# Patient Record
Sex: Female | Born: 1975 | Race: White | Hispanic: No | Smoking: Former smoker
Health system: Southern US, Community
[De-identification: ages and names within clinical notes are randomized; demographics above are authoritative.]

## PROBLEM LIST (undated history)

## (undated) DIAGNOSIS — E119 Type 2 diabetes mellitus without complications: Secondary | ICD-10-CM

## (undated) DIAGNOSIS — N200 Calculus of kidney: Secondary | ICD-10-CM

## (undated) DIAGNOSIS — F419 Anxiety disorder, unspecified: Secondary | ICD-10-CM

## (undated) DIAGNOSIS — D649 Anemia, unspecified: Secondary | ICD-10-CM

## (undated) HISTORY — DX: Calculus of kidney: N20.0

## (undated) HISTORY — DX: Type 2 diabetes mellitus without complications: E11.9

## (undated) HISTORY — DX: Anxiety disorder, unspecified: F41.9

## (undated) HISTORY — DX: Anemia, unspecified: D64.9

---

## 2007-06-19 ENCOUNTER — Emergency Department (HOSPITAL_COMMUNITY): Admission: EM | Admit: 2007-06-19 | Discharge: 2007-06-20 | Payer: Self-pay | Admitting: Emergency Medicine

## 2009-01-09 ENCOUNTER — Emergency Department (HOSPITAL_COMMUNITY): Admission: EM | Admit: 2009-01-09 | Discharge: 2009-01-09 | Payer: Self-pay | Admitting: Emergency Medicine

## 2009-01-24 ENCOUNTER — Other Ambulatory Visit: Admission: RE | Admit: 2009-01-24 | Discharge: 2009-01-24 | Payer: Self-pay | Admitting: Obstetrics and Gynecology

## 2009-07-25 ENCOUNTER — Inpatient Hospital Stay (HOSPITAL_COMMUNITY): Admission: AD | Admit: 2009-07-25 | Discharge: 2009-07-25 | Payer: Self-pay | Admitting: Obstetrics and Gynecology

## 2009-08-01 ENCOUNTER — Ambulatory Visit (HOSPITAL_COMMUNITY): Admission: RE | Admit: 2009-08-01 | Discharge: 2009-08-01 | Payer: Self-pay | Admitting: Obstetrics and Gynecology

## 2009-08-04 ENCOUNTER — Inpatient Hospital Stay (HOSPITAL_COMMUNITY): Admission: AD | Admit: 2009-08-04 | Discharge: 2009-08-06 | Payer: Self-pay | Admitting: Obstetrics and Gynecology

## 2010-04-20 ENCOUNTER — Encounter: Payer: Self-pay | Admitting: Obstetrics and Gynecology

## 2010-06-17 LAB — CBC
HCT: 32.1 % — ABNORMAL LOW (ref 36.0–46.0)
HCT: 35.7 % — ABNORMAL LOW (ref 36.0–46.0)
Hemoglobin: 10.8 g/dL — ABNORMAL LOW (ref 12.0–15.0)
MCHC: 33.7 g/dL (ref 30.0–36.0)
MCV: 89.4 fL (ref 78.0–100.0)
MCV: 89.7 fL (ref 78.0–100.0)
Platelets: 207 10*3/uL (ref 150–400)
RBC: 3.59 MIL/uL — ABNORMAL LOW (ref 3.87–5.11)
RBC: 3.98 MIL/uL (ref 3.87–5.11)
WBC: 17.1 10*3/uL — ABNORMAL HIGH (ref 4.0–10.5)

## 2010-06-17 LAB — URINE MICROSCOPIC-ADD ON

## 2010-06-17 LAB — URINALYSIS, ROUTINE W REFLEX MICROSCOPIC
Bilirubin Urine: NEGATIVE
Glucose, UA: NEGATIVE mg/dL
Specific Gravity, Urine: 1.02 (ref 1.005–1.030)
pH: 6.5 (ref 5.0–8.0)

## 2010-07-03 LAB — URINE MICROSCOPIC-ADD ON

## 2010-07-03 LAB — URINALYSIS, ROUTINE W REFLEX MICROSCOPIC
Leukocytes, UA: NEGATIVE
Nitrite: NEGATIVE
Specific Gravity, Urine: 1.009 (ref 1.005–1.030)
Urobilinogen, UA: 0.2 mg/dL (ref 0.0–1.0)
pH: 7.5 (ref 5.0–8.0)

## 2010-07-03 LAB — DIFFERENTIAL
Basophils Absolute: 0.1 10*3/uL (ref 0.0–0.1)
Basophils Relative: 1 % (ref 0–1)
Eosinophils Absolute: 0.1 10*3/uL (ref 0.0–0.7)
Monocytes Absolute: 0.8 10*3/uL (ref 0.1–1.0)
Monocytes Relative: 6 % (ref 3–12)
Neutro Abs: 9.6 10*3/uL — ABNORMAL HIGH (ref 1.7–7.7)

## 2010-07-03 LAB — WET PREP, GENITAL
WBC, Wet Prep HPF POC: NONE SEEN
Yeast Wet Prep HPF POC: NONE SEEN

## 2010-07-03 LAB — GC/CHLAMYDIA PROBE AMP, GENITAL
Chlamydia, DNA Probe: NEGATIVE
GC Probe Amp, Genital: NEGATIVE

## 2010-07-03 LAB — RHOGAM INJECTION

## 2010-07-03 LAB — CBC
Hemoglobin: 12.4 g/dL (ref 12.0–15.0)
MCHC: 34.4 g/dL (ref 30.0–36.0)
MCV: 95.7 fL (ref 78.0–100.0)
RDW: 12.8 % (ref 11.5–15.5)

## 2010-07-03 LAB — BASIC METABOLIC PANEL
CO2: 24 mEq/L (ref 19–32)
Calcium: 9.2 mg/dL (ref 8.4–10.5)
Chloride: 104 mEq/L (ref 96–112)
Creatinine, Ser: 0.52 mg/dL (ref 0.4–1.2)
Glucose, Bld: 89 mg/dL (ref 70–99)
Sodium: 137 mEq/L (ref 135–145)

## 2010-12-22 LAB — URINALYSIS, ROUTINE W REFLEX MICROSCOPIC
Glucose, UA: NEGATIVE
Ketones, ur: 15 — AB
Leukocytes, UA: NEGATIVE
Protein, ur: 30 — AB
pH: 6

## 2010-12-22 LAB — URINE MICROSCOPIC-ADD ON

## 2010-12-22 LAB — URINE CULTURE

## 2010-12-22 LAB — POCT PREGNANCY, URINE: Operator id: 277751

## 2017-03-30 HISTORY — PX: CRYOABLATION: SHX1415

## 2019-06-07 ENCOUNTER — Other Ambulatory Visit: Payer: Self-pay | Admitting: Obstetrics & Gynecology

## 2019-06-07 ENCOUNTER — Other Ambulatory Visit: Payer: Self-pay | Admitting: *Deleted

## 2019-06-07 DIAGNOSIS — R928 Other abnormal and inconclusive findings on diagnostic imaging of breast: Secondary | ICD-10-CM

## 2019-06-13 ENCOUNTER — Ambulatory Visit
Admission: RE | Admit: 2019-06-13 | Discharge: 2019-06-13 | Disposition: A | Discharge status: Home / Self Care | Payer: Self-pay | Source: Ambulatory Visit | Attending: Obstetrics & Gynecology | Admitting: Obstetrics & Gynecology

## 2019-06-13 ENCOUNTER — Other Ambulatory Visit: Payer: Self-pay

## 2019-06-13 DIAGNOSIS — R928 Other abnormal and inconclusive findings on diagnostic imaging of breast: Secondary | ICD-10-CM

## 2020-08-15 ENCOUNTER — Encounter: Payer: Self-pay | Admitting: Nurse Practitioner

## 2020-08-15 ENCOUNTER — Ambulatory Visit (INDEPENDENT_AMBULATORY_CARE_PROVIDER_SITE_OTHER): Payer: Self-pay | Admitting: Nurse Practitioner

## 2020-08-15 VITALS — BP 120/76 | HR 76 | Temp 97.8°F | Ht 61.0 in | Wt 120.0 lb

## 2020-08-15 DIAGNOSIS — Z6822 Body mass index (BMI) 22.0-22.9, adult: Secondary | ICD-10-CM

## 2020-08-15 DIAGNOSIS — N920 Excessive and frequent menstruation with regular cycle: Secondary | ICD-10-CM

## 2020-08-15 DIAGNOSIS — Z7689 Persons encountering health services in other specified circumstances: Secondary | ICD-10-CM

## 2020-08-15 DIAGNOSIS — N200 Calculus of kidney: Secondary | ICD-10-CM

## 2020-08-15 DIAGNOSIS — L52 Erythema nodosum: Secondary | ICD-10-CM

## 2020-08-15 DIAGNOSIS — D649 Anemia, unspecified: Secondary | ICD-10-CM

## 2020-08-15 DIAGNOSIS — F411 Generalized anxiety disorder: Secondary | ICD-10-CM

## 2020-08-15 DIAGNOSIS — Z87891 Personal history of nicotine dependence: Secondary | ICD-10-CM

## 2020-08-15 NOTE — Progress Notes (Signed)
New Patient Office Visit  Subjective:  Patient ID: Xela Oregel, female    DOB: 1975-08-20  Age: 45 y.o. MRN: [id number]  CC: Anemia   HPI Azhar Yogi is a 45 year old Caucasian female that presents to establish care with a primary care provider. This is her initial visit to the office.She was seen at [facility] on 07/27/20 for left flank pain and skin lesions on bilateral legs/face. She tells me she has chronic kidney stones. CBC drawn during that visit revealed slight anemia with Hgb 11.2 and 33.6. Shambria tells me that she has chronic anemia for several years. States menstrual cycles menstrual cycles are regular with menorrhagia. She tells me she bruises easily, has fatigue, and becomes mildly short of breath with activity. She is a former cigarette smoker; quit 4 years ago. Denies previous history of COPD or chronic cough.   Kyarra states she has not had a routine eye or dental exam in the past three years. She had an abnormal mammogram 2021 that required diagnostic ultrasound, which was negative. She prefers to follow-up with physician she saw last year for next mammogram screening. Kajah tells me she had an abnormal pap smear in 2019 and had cryotherapy surgery.   Paidyn states she has a history of anxiety.She is not currently taking any medication to alleviate symptoms or in counseling. GAD-7 score 6 and PHQ-9 score 2 today in office. Keyari states she has experienced increased stress due to assisting aging parents with failing health and home schooling /raising 46 year-old daughter. She declined need for pharmacotherapy management or counseling therapy at this time.   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 She denies recent strep pharyngitis or illness. States lesions have subsided on bilateral legs and face.She tells me that resemble bruised like areas on skin. States they are not bothersome.  Social History   Socioeconomic History  .  Marital status: Single    Spouse name: Not on file  . Number of children: Not on file  . Years of education: Not on file  . Highest education level: Not on file  Occupational History  . Not on file  Tobacco Use  . Smoking status: Not on file  . Smokeless tobacco: Not on file  Substance and Sexual Activity  . Alcohol use: Not on file  . Drug use: Not on file  . Sexual activity: Not on file  Other Topics Concern  . Not on file  Social History Narrative  . Not on file    ROS Review of Systems  Constitutional: Positive for fatigue. Negative for appetite change and unexpected weight change.  HENT: Positive for congestion. Negative for ear pain, rhinorrhea, sinus pressure, sinus pain and tinnitus.   Eyes: Negative for pain.  Respiratory: Positive for shortness of breath (mild with activity). Negative for cough.   Cardiovascular: Negative for chest pain, palpitations and leg swelling.  Gastrointestinal: Negative for abdominal pain, constipation, diarrhea, nausea and vomiting.  Endocrine: Negative for cold intolerance, heat intolerance, polydipsia, polyphagia and polyuria.  Genitourinary: Positive for frequency and menstrual problem (heavy menstrual ). Negative for dysuria and hematuria.       Chronic kidney stones  Musculoskeletal: Negative for arthralgias, back pain, joint swelling and myalgias.  Skin: Negative for rash.       Erythema nordosum  Allergic/Immunologic: Negative for environmental allergies.  Neurological: Negative for dizziness, seizures, syncope and headaches.  Hematological: Negative for adenopathy. Bruises/bleeds easily (bruises easily).  Psychiatric/Behavioral: Positive for sleep  disturbance. Negative for decreased concentration. The patient is not nervous/anxious.     Objective:   Today's Vitals: BP 120/76 (BP Location: Left Arm, Patient Position: Sitting)   Pulse 76   Temp 97.8 F (36.6 C) (Temporal)   Ht 5\' 1"  (1.549 m)   Wt 120 lb (54.4 kg)   SpO2 99%    BMI 22.67 kg/m   Physical Exam Vitals reviewed.  Constitutional:      Appearance: Normal appearance.  HENT:     Head: Normocephalic.     Right Ear: Tympanic membrane normal.     Left Ear: Tympanic membrane normal.     Nose: Nose normal.     Mouth/Throat:     Mouth: Mucous membranes are moist.  Eyes:     Pupils: Pupils are equal, round, and reactive to light.  Cardiovascular:     Rate and Rhythm: Normal rate and regular rhythm.     Pulses: Normal pulses.     Heart sounds: Normal heart sounds.  Pulmonary:     Effort: Pulmonary effort is normal.     Breath sounds: Normal breath sounds.  Abdominal:     General: Bowel sounds are normal.     Palpations: Abdomen is soft.  Musculoskeletal:        General: Normal range of motion.     Cervical back: Neck supple.  Skin:    General: Skin is warm and dry.     Capillary Refill: Capillary refill takes less than 2 seconds.  Neurological:     General: No focal deficit present.     Mental Status: She is alert and oriented to person, place, and time.  Psychiatric:        Mood and Affect: Mood normal.        Behavior: Behavior normal.     Assessment & Plan:   1. Menorrhagia with regular cycle -TSH  2. Anemia, unspecified type - Comprehensive metabolic panel - TSH - B12 and Folate Panel - Lipid panel -continue daily multivitamin  3. Recurrent kidney stones - Comprehensive metabolic panel  4. Erythema nodosum -monitor for recurrence of symptoms  5. GAD (generalized anxiety disorder) -GAD 7 score 6 in office -Pt declined need for assistance with symptoms at this time  6. Former smoker  7. BMI 22.0-22.9, adult -continue heart healthy diet  -continue physical activity  8. Encounter to establish care with new doctor   We will call you with lab results Follow-up in 1 year or sooner if needed Recommend routine eye and dental exam Follow up for repeat mammogram and pap smear  as scheduled    Follow-up: 1-year    11-17-1983, NP

## 2020-08-15 NOTE — Patient Instructions (Addendum)
We will call you with lab results Follow-up in 1 year or sooner if needed Recommend routine eye and dental exam Follow up for repeat mammogram and pap smear  as scheduled  Preventive Care 46-46 Years Old, Female Preventive care refers to lifestyle choices and visits with your health care provider that can promote health and wellness. This includes:  A yearly physical exam. This is also called an annual wellness visit.  Regular dental and eye exams.  Immunizations.  Screening for certain conditions.  Healthy lifestyle choices, such as: ? Eating a healthy diet. ? Getting regular exercise. ? Not using drugs or products that contain nicotine and tobacco. ? Limiting alcohol use. What can I expect for my preventive care visit? Physical exam Your health care provider will check your:  Height and weight. These may be used to calculate your BMI (body mass index). BMI is a measurement that tells if you are at a healthy weight.  Heart rate and blood pressure.  Body temperature.  Skin for abnormal spots. Counseling Your health care provider may ask you questions about your:  Past medical problems.  Family's medical history.  Alcohol, tobacco, and drug use.  Emotional well-being.  Home life and relationship well-being.  Sexual activity.  Diet, exercise, and sleep habits.  Work and work Statistician.  Access to firearms.  Method of birth control.  Menstrual cycle.  Pregnancy history. What immunizations do I need? Vaccines are usually given at various ages, according to a schedule. Your health care provider will recommend vaccines for you based on your age, medical history, and lifestyle or other factors, such as travel or where you work.   What tests do I need? Blood tests  Lipid and cholesterol levels. These may be checked every 5 years, or more often if you are over 76 years old.  Hepatitis C test.  Hepatitis B test. Screening  Lung cancer screening. You may  have this screening every year starting at age 46 if you have a 30-pack-year history of smoking and currently smoke or have quit within the past 15 years.  Colorectal cancer screening. ? All adults should have this screening starting at age 31 and continuing until age 35. ? Your health care provider may recommend screening at age 35 if you are at increased risk. ? You will have tests every 1-10 years, depending on your results and the type of screening test.  Diabetes screening. ? This is done by checking your blood sugar (glucose) after you have not eaten for a while (fasting). ? You may have this done every 1-3 years.  Mammogram. ? This may be done every 1-2 years. ? Talk with your health care provider about when you should start having regular mammograms. This may depend on whether you have a family history of breast cancer.  BRCA-related cancer screening. This may be done if you have a family history of breast, ovarian, tubal, or peritoneal cancers.  Pelvic exam and Pap test. ? This may be done every 3 years starting at age 46. ? Starting at age 64, this may be done every 5 years if you have a Pap test in combination with an HPV test. Other tests  STD (sexually transmitted disease) testing, if you are at risk.  Bone density scan. This is done to screen for osteoporosis. You may have this scan if you are at high risk for osteoporosis. Talk with your health care provider about your test results, treatment options, and if necessary, the need for more  tests. Follow these instructions at home: Eating and drinking  Eat a diet that includes fresh fruits and vegetables, whole grains, lean protein, and low-fat dairy products.  Take vitamin and mineral supplements as recommended by your health care provider.  Do not drink alcohol if: ? Your health care provider tells you not to drink. ? You are pregnant, may be pregnant, or are planning to become pregnant.  If you drink  alcohol: ? Limit how much you have to 0-1 drink a day. ? Be aware of how much alcohol is in your drink. In the [place] one drink equals one 12 oz bottle of beer (355 mL), one 5 oz glass of wine (148 mL), or one 1 oz glass of hard liquor (44 mL).   Lifestyle  Take daily care of your teeth and gums. Brush your teeth every morning and night with fluoride toothpaste. Floss one time each day.  Stay active. Exercise for at least 30 minutes 5 or more days each week.  Do not use any products that contain nicotine or tobacco, such as cigarettes, e-cigarettes, and chewing tobacco. If you need help quitting, ask your health care provider.  Do not use drugs.  If you are sexually active, practice safe sex. Use a condom or other form of protection to prevent STIs (sexually transmitted infections).  If you do not wish to become pregnant, use a form of birth control. If you plan to become pregnant, see your health care provider for a prepregnancy visit.  If told by your health care provider, take low-dose aspirin daily starting at age 36.  Find healthy ways to cope with stress, such as: ? Meditation, yoga, or listening to music. ? Journaling. ? Talking to a trusted person. ? Spending time with friends and family. Safety  Always wear your seat belt while driving or riding in a vehicle.  Do not drive: ? If you have been drinking alcohol. Do not ride with someone who has been drinking. ? When you are tired or distracted. ? While texting.  Wear a helmet and other protective equipment during sports activities.  If you have firearms in your house, make sure you follow all gun safety procedures. What's next?  Visit your health care provider once a year for an annual wellness visit.  Ask your health care provider how often you should have your eyes and teeth checked.  Stay up to date on all vaccines. This information is not intended to replace advice given to you by your health care provider. Make  sure you discuss any questions you have with your health care provider. Document Revised: 12/19/2019 Document Reviewed: 11/25/2017 Elsevier Patient Education  2021 [place]. Iron-Rich Diet  Iron is a mineral that helps your body to produce hemoglobin. Hemoglobin is a protein in red blood cells that carries oxygen to your body's tissues. Eating too little iron may cause you to feel weak and tired, and it can increase your risk of infection. Iron is naturally found in many foods, and many foods have iron added to them (iron-fortified foods). You may need to follow an iron-rich diet if you do not have enough iron in your body due to certain medical conditions. The amount of iron that you need each day depends on your age, your sex, and any medical conditions you have. Follow instructions from your health care provider or a diet and nutrition specialist (dietitian) about how much iron you should eat each day. What are tips for following this plan? Reading  food labels  Check food labels to see how many milligrams (mg) of iron are in each serving. Cooking  Cook foods in pots and pans that are made from iron.  Take these steps to make it easier for your body to absorb iron from certain foods: ? Soak beans overnight before cooking. ? Soak whole grains overnight and drain them before using. ? Ferment flours before baking, such as by using yeast in bread dough. Meal planning  When you eat foods that contain iron, you should eat them with foods that are high in vitamin C. These include oranges, peppers, tomatoes, potatoes, and mango. Vitamin C helps your body to absorb iron. General information  Take iron supplements only as told by your health care provider. An overdose of iron can be life-threatening. If you were prescribed iron supplements, take them with orange juice or a vitamin C supplement.  When you eat iron-fortified foods or take an iron supplement, you should also eat foods that  naturally contain iron, such as meat, poultry, and fish. Eating naturally iron-rich foods helps your body to absorb the iron that is added to other foods or contained in a supplement.  Certain foods and drinks prevent your body from absorbing iron properly. Avoid eating these foods in the same meal as iron-rich foods or with iron supplements. These foods include: ? Coffee, black tea, and red wine. ? Milk, dairy products, and foods that are high in calcium. ? Beans and soybeans. ? Whole grains. What foods should I eat? Fruits Prunes. Raisins. Eat fruits high in vitamin C, such as oranges, grapefruits, and strawberries, alongside iron-rich foods. Vegetables Spinach (cooked). Green peas. Broccoli. Fermented vegetables. Eat vegetables high in vitamin C, such as leafy greens, potatoes, bell peppers, and tomatoes, alongside iron-rich foods. Grains Iron-fortified breakfast cereal. Iron-fortified whole-wheat bread. Enriched rice. Sprouted grains. Meats and other proteins Beef liver. Oysters. Beef. Shrimp. [place]. Chicken. [place]. Sardines. Chickpeas. Nuts. Tofu. Pumpkin seeds. Beverages Tomato juice. Fresh orange juice. Prune juice. Hibiscus tea. Fortified instant breakfast shakes. Sweets and desserts Blackstrap molasses. Seasonings and condiments Tahini. Fermented soy sauce. Other foods Wheat germ. The items listed above may not be a complete list of recommended foods and beverages. Contact a dietitian for more information. What foods should I avoid? Grains Whole grains. Bran cereal. Bran flour. Oats. Meats and other proteins Soybeans. Products made from soy protein. Black beans. Lentils. Mung beans. Split peas. Dairy Milk. Cream. Cheese. Yogurt. Cottage cheese. Beverages Coffee. Black tea. Red wine. Sweets and desserts Cocoa. Chocolate. Ice cream. Other foods Basil. Oregano. Large amounts of parsley. The items listed above may not be a complete list of foods and beverages to avoid.  Contact a dietitian for more information. Summary  Iron is a mineral that helps your body to produce hemoglobin. Hemoglobin is a protein in red blood cells that carries oxygen to your body's tissues.  Iron is naturally found in many foods, and many foods have iron added to them (iron-fortified foods).  When you eat foods that contain iron, you should eat them with foods that are high in vitamin C. Vitamin C helps your body to absorb iron.  Certain foods and drinks prevent your body from absorbing iron properly, such as whole grains and dairy products. You should avoid eating these foods in the same meal as iron-rich foods or with iron supplements. This information is not intended to replace advice given to you by your health care provider. Make sure you discuss any questions you have  with your health care provider. Document Revised: 02/26/2017 Document Reviewed: 02/09/2017 Elsevier Patient Education  [place]. Goldman-[place] medicine (25th ed., pp. [phone]). [place], [place]: Elsevier.">  Anemia  Anemia is a condition in which there is not enough red blood cells or hemoglobin in the blood. Hemoglobin is a substance in red blood cells that carries oxygen. When you do not have enough red blood cells or hemoglobin (are anemic), your body cannot get enough oxygen and your organs may not work properly. As a result, you may feel very tired or have other problems. What are the causes? Common causes of anemia include:  Excessive bleeding. Anemia can be caused by excessive bleeding inside or outside the body, including bleeding from the intestines or from heavy menstrual periods in females.  Poor nutrition.  Long-lasting (chronic) kidney, thyroid, and liver disease.  Bone marrow disorders, spleen problems, and blood disorders.  Cancer and treatments for cancer.  HIV (human immunodeficiency virus) and AIDS (acquired immunodeficiency syndrome).  Infections, medicines, and autoimmune  disorders that destroy red blood cells. What are the signs or symptoms? Symptoms of this condition include:  Minor weakness.  Dizziness.  Headache, or difficulties concentrating and sleeping.  Heartbeats that feel irregular or faster than normal (palpitations).  Shortness of breath, especially with exercise.  Pale skin, lips, and nails, or cold hands and feet.  Indigestion and nausea. Symptoms may occur suddenly or develop slowly. If your anemia is mild, you may not have symptoms. How is this diagnosed? This condition is diagnosed based on blood tests, your medical history, and a physical exam. In some cases, a test may be needed in which cells are removed from the soft tissue inside of a bone and looked at under a microscope (bone marrow biopsy). Your health care provider may also check your stool (feces) for blood and may do additional testing to look for the cause of your bleeding. Other tests may include:  Imaging tests, such as a CT scan or MRI.  A procedure to see inside your esophagus and stomach (endoscopy).  A procedure to see inside your colon and rectum (colonoscopy). How is this treated? Treatment for this condition depends on the cause. If you continue to lose a lot of blood, you may need to be treated at a hospital. Treatment may include:  Taking supplements of iron, vitamin B63, or folic acid.  Taking a hormone medicine (erythropoietin) that can help to stimulate red blood cell growth.  Having a blood transfusion. This may be needed if you lose a lot of blood.  Making changes to your diet.  Having surgery to remove your spleen. Follow these instructions at home:  Take over-the-counter and prescription medicines only as told by your health care provider.  Take supplements only as told by your health care provider.  Follow any diet instructions that you were given by your health care provider.  Keep all follow-up visits as told by your health care provider.  This is important. Contact a health care provider if:  You develop new bleeding anywhere in the body. Get help right away if:  You are very weak.  You are short of breath.  You have pain in your abdomen or chest.  You are dizzy or feel faint.  You have trouble concentrating.  You have bloody stools, black stools, or tarry stools.  You vomit repeatedly or you vomit up blood. These symptoms may represent a serious problem that is an emergency. Do not wait to see if the symptoms  will go away. Get medical help right away. Call your local emergency services (911 in the [place] Do not drive yourself to the hospital. Summary  Anemia is a condition in which you do not have enough red blood cells or enough of a substance in your red blood cells that carries oxygen (hemoglobin).  Symptoms may occur suddenly or develop slowly.  If your anemia is mild, you may not have symptoms.  This condition is diagnosed with blood tests, a medical history, and a physical exam. Other tests may be needed.  Treatment for this condition depends on the cause of the anemia. This information is not intended to replace advice given to you by your health care provider. Make sure you discuss any questions you have with your health care provider. Document Revised: 02/21/2019 Document Reviewed: 02/21/2019 Elsevier Patient Education  2021 [employer].

## 2020-08-16 LAB — COMPREHENSIVE METABOLIC PANEL
ALT: 10 IU/L (ref 0–32)
AST: 13 IU/L (ref 0–40)
Albumin/Globulin Ratio: 2.5 — ABNORMAL HIGH (ref 1.2–2.2)
Albumin: 4.8 g/dL (ref 3.8–4.8)
Alkaline Phosphatase: 67 IU/L (ref 44–121)
BUN/Creatinine Ratio: 20 (ref 9–23)
BUN: 17 mg/dL (ref 6–24)
Bilirubin Total: <0.2 mg/dL (ref 0.0–1.2)
CO2: 20 mmol/L (ref 20–29)
Calcium: 9.6 mg/dL (ref 8.7–10.2)
Chloride: 104 mmol/L (ref 96–106)
Creatinine, Ser: 0.84 mg/dL (ref 0.57–1.00)
Globulin, Total: 1.9 g/dL (ref 1.5–4.5)
Glucose: 96 mg/dL (ref 65–99)
Potassium: 4.5 mmol/L (ref 3.5–5.2)
Sodium: 141 mmol/L (ref 134–144)
Total Protein: 6.7 g/dL (ref 6.0–8.5)
eGFR: 87 mL/min/{1.73_m2} (ref >59)

## 2020-08-16 LAB — TSH: TSH: 1.26 u[IU]/mL (ref 0.450–4.500)

## 2020-08-16 LAB — LIPID PANEL
Chol/HDL Ratio: 2.8 ratio (ref 0.0–4.4)
Cholesterol, Total: 182 mg/dL (ref 100–199)
HDL: 66 mg/dL (ref >39)
LDL Chol Calc (NIH): 105 mg/dL — ABNORMAL HIGH (ref 0–99)
Triglycerides: 56 mg/dL (ref 0–149)
VLDL Cholesterol Cal: 11 mg/dL (ref 5–40)

## 2020-08-16 LAB — B12 AND FOLATE PANEL
Folate: 10 ng/mL (ref >3.0)
Vitamin B-12: 722 pg/mL (ref 232–1245)

## 2020-08-16 LAB — CARDIOVASCULAR RISK ASSESSMENT

## 2020-11-07 IMAGING — US US BREAST*R* LIMITED INC AXILLA
1 series · 13 of 15 positions shown · non-contrast
Comparison: Baseline screening mammogram dated 06/06/2019.

CLINICAL DATA: Patient returns today to evaluate possible bilateral
breast masses identified on recent baseline screening mammogram.

EXAM:
DIGITAL DIAGNOSTIC BILATERAL MAMMOGRAM WITH CAD AND TOMO
ULTRASOUND BILATERAL BREAST

[Series 1: us breast*right* limited inc axilla · 0.06mm/px · 13 of 15 slices shown]
[im 1/15]
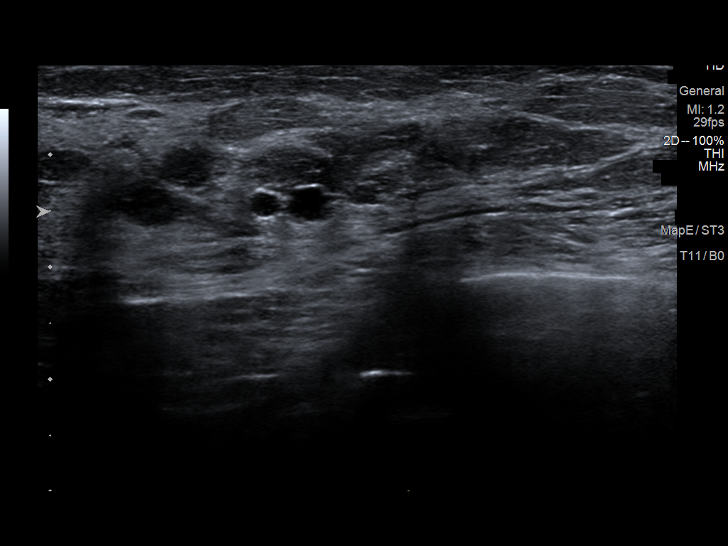
[im 2/15]
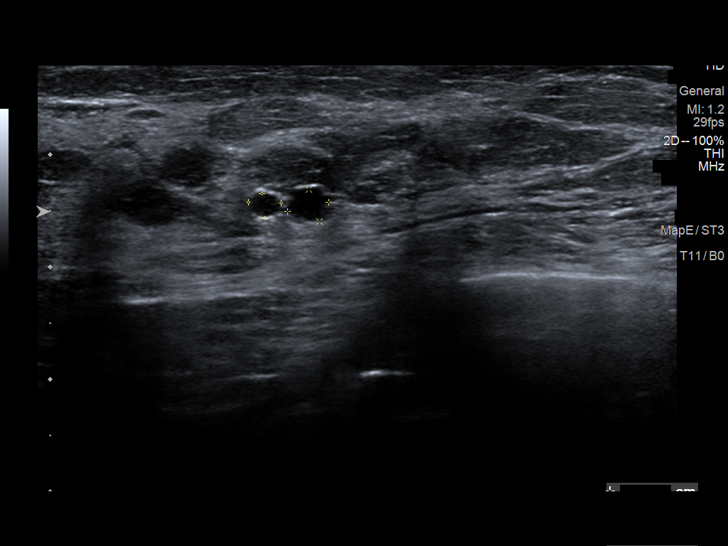
[im 3/15]
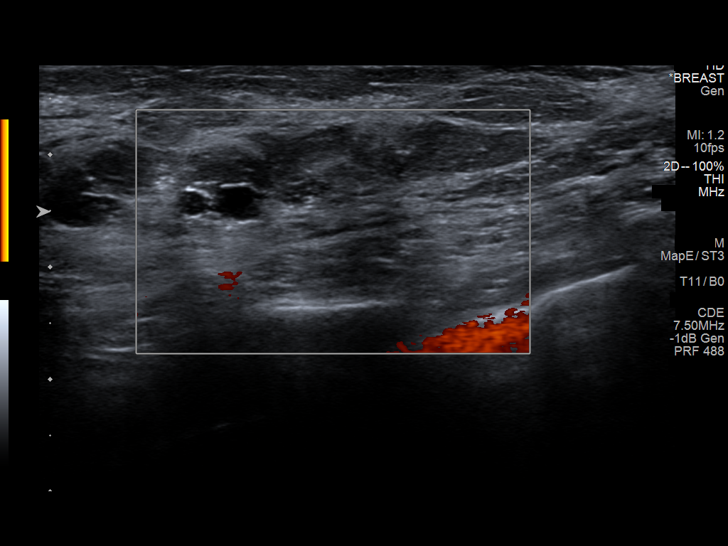
[im 5/15]
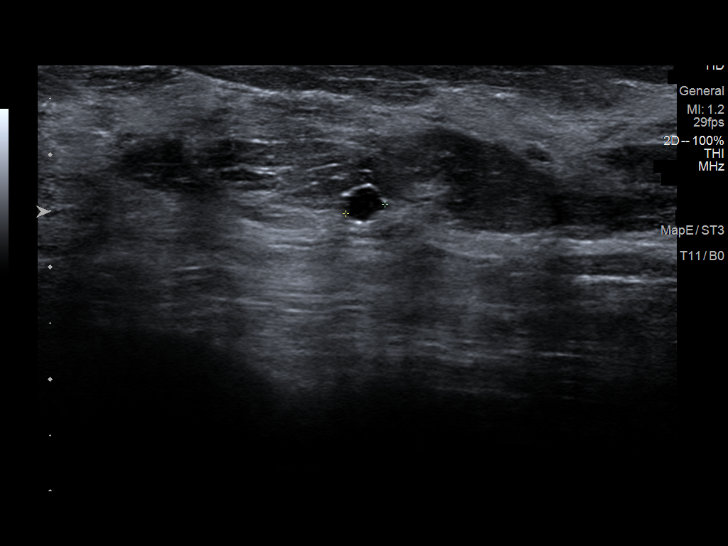
[im 6/15]
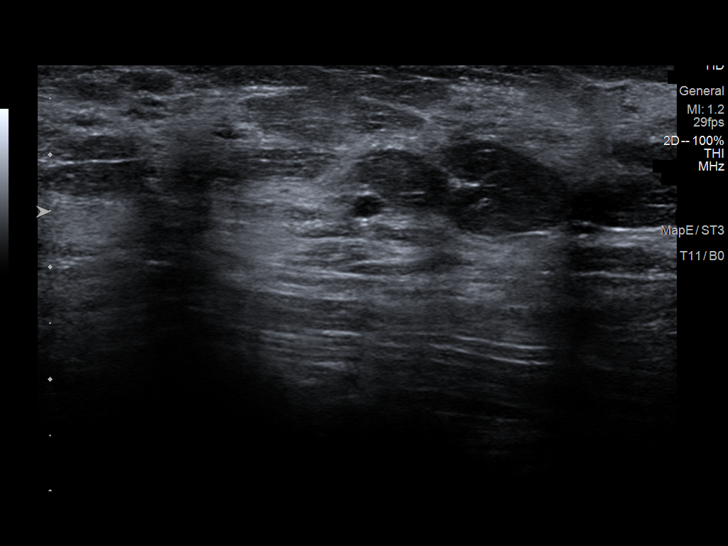
[im 7/15]
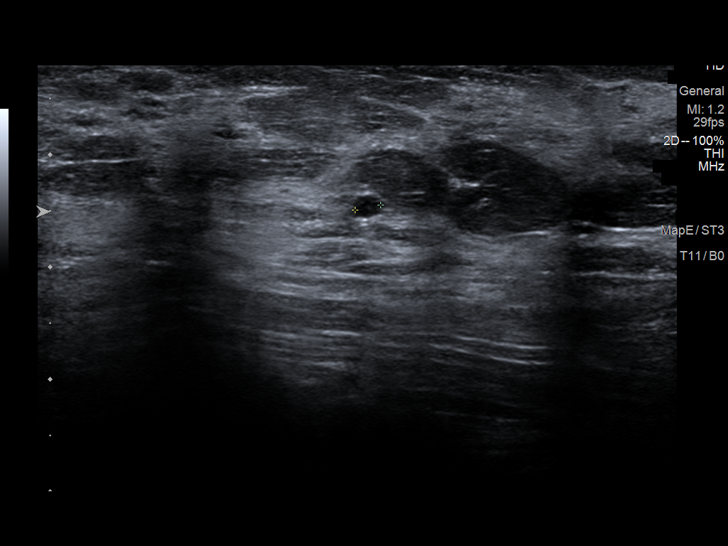
[im 8/15]
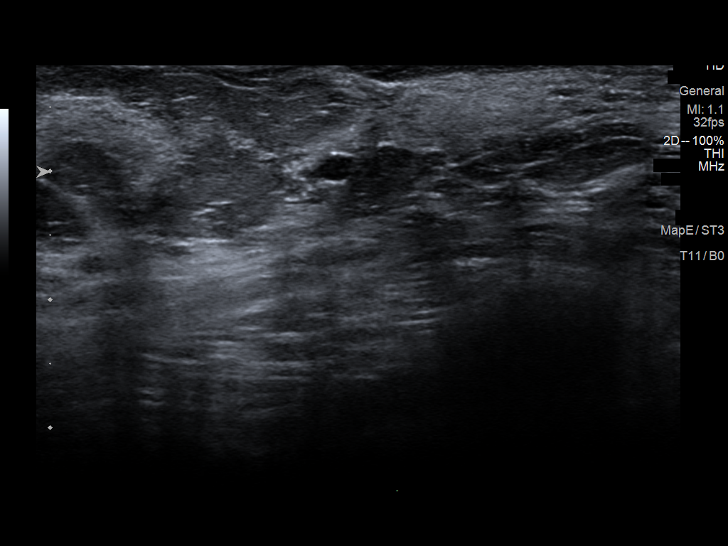
[im 9/15]
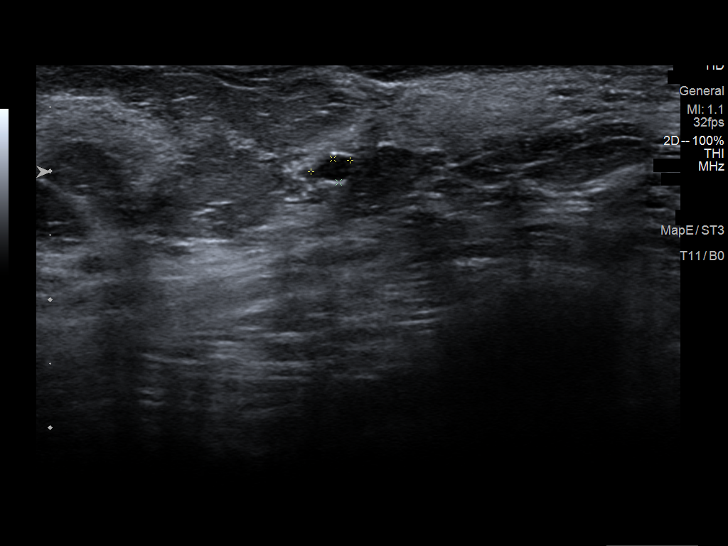
[im 10/15]
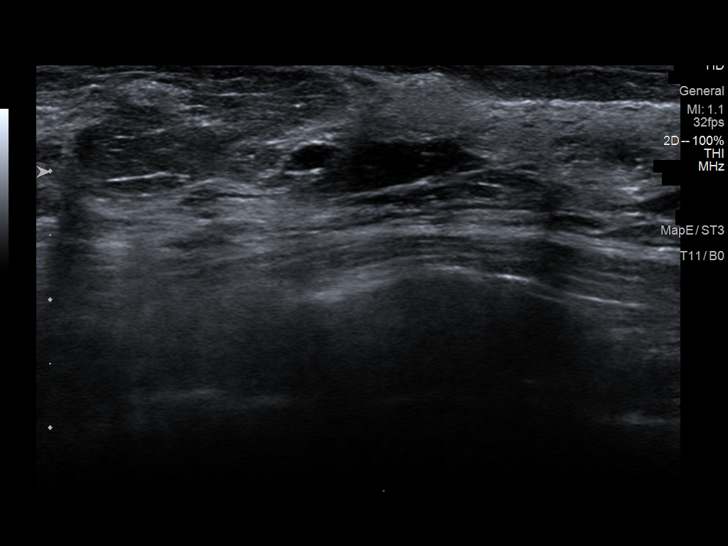
[im 11/15]
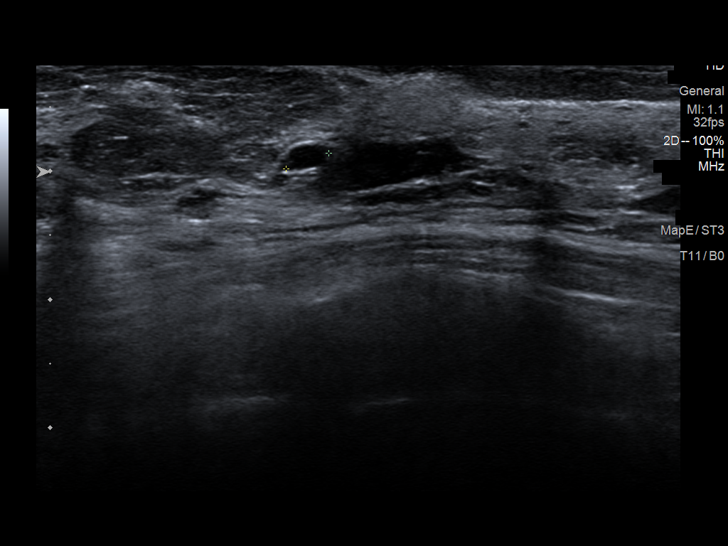
[im 13/15]
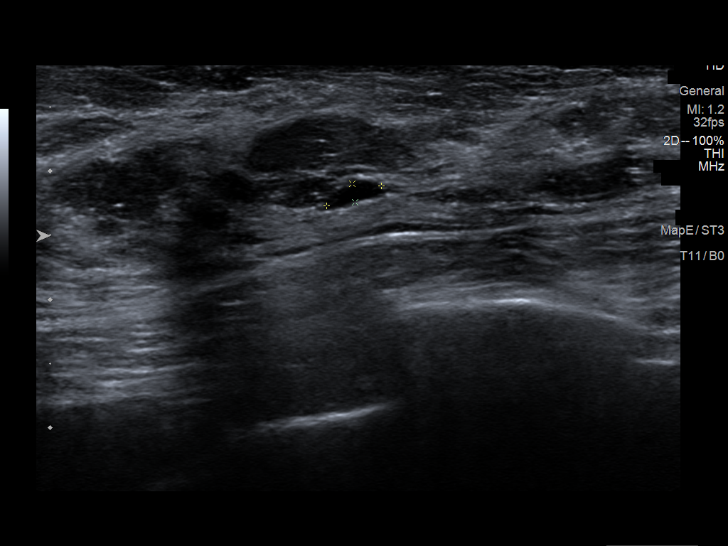
[im 14/15]
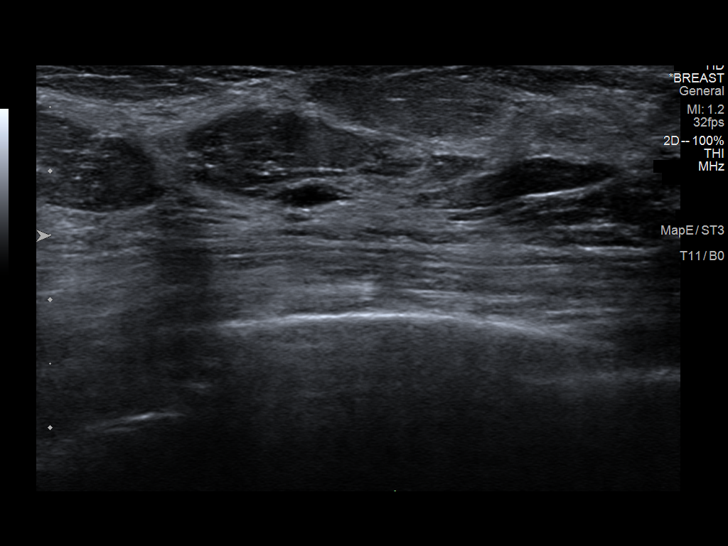
[im 15/15]
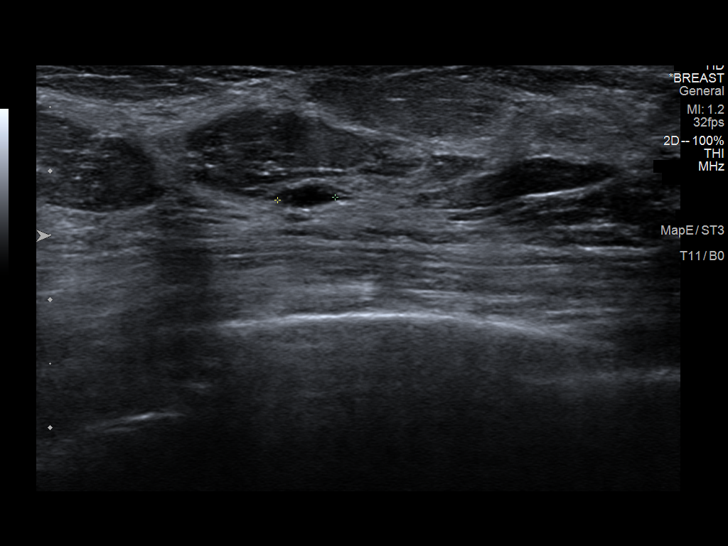

[13 of 15 positions shown; findings below may reference images not displayed]

ACR Breast Density Category c: The breast tissue is heterogeneously
dense, which may obscure small masses.
FINDINGS: RIGHT breast: On today's additional diagnostic views, multiple
obscured masses are confirmed within the outer RIGHT breast.

LEFT breast: On today's additional diagnostic views, a partially
obscured mass is confirmed within the upper LEFT breast.

Mammographic images were processed with CAD.

RIGHT breast: Targeted ultrasound is performed, showing 2 abutting
benign cysts at the 10 o'clock axis, 3 cm from the nipple, measuring
4 mm and 3 mm respectively, corresponding to the mammographic
finding. There is an additional benign cyst within the RIGHT breast
at the 9 o'clock axis, measuring 4 mm, corresponding as an
incidental finding. No suspicious solid or cystic mass is identified
by ultrasound.

LEFT breast: Targeted ultrasound is performed, showing a benign cyst
at the 1:30 o'clock axis, 4 cm from the nipple, measuring 7 mm,
corresponding to mammographic finding. Additional benign cluster of
cysts are identified in the LEFT breast at the 1:30 o'clock axis, 5
cm from the nipple, corresponding as an incidental finding. No
suspicious solid or cystic mass is identified by ultrasound.
IMPRESSION: No evidence of malignancy within either breast. Benign cysts within
each breast, corresponding to the mammographic findings.

Patient may return to routine annual bilateral screening mammogram
schedule.

RECOMMENDATION:
Screening mammogram in one year.(Code:BP-[id number])

I have discussed the findings and recommendations with the patient.
If applicable, a reminder letter will be sent to the patient
regarding the next appointment.

BI-RADS CATEGORY  2: Benign.

## 2020-11-07 IMAGING — MG DIGITAL DIAGNOSTIC BILAT W/ TOMO W/ CAD
6 of 10 series · 6 of 30 positions shown · non-contrast
Comparison: Baseline screening mammogram dated 06/06/2019.

CLINICAL DATA: Patient returns today to evaluate possible bilateral
breast masses identified on recent baseline screening mammogram.

EXAM:
DIGITAL DIAGNOSTIC BILATERAL MAMMOGRAM WITH CAD AND TOMO
ULTRASOUND BILATERAL BREAST

[L MLO synth-2D (1 of 2)]
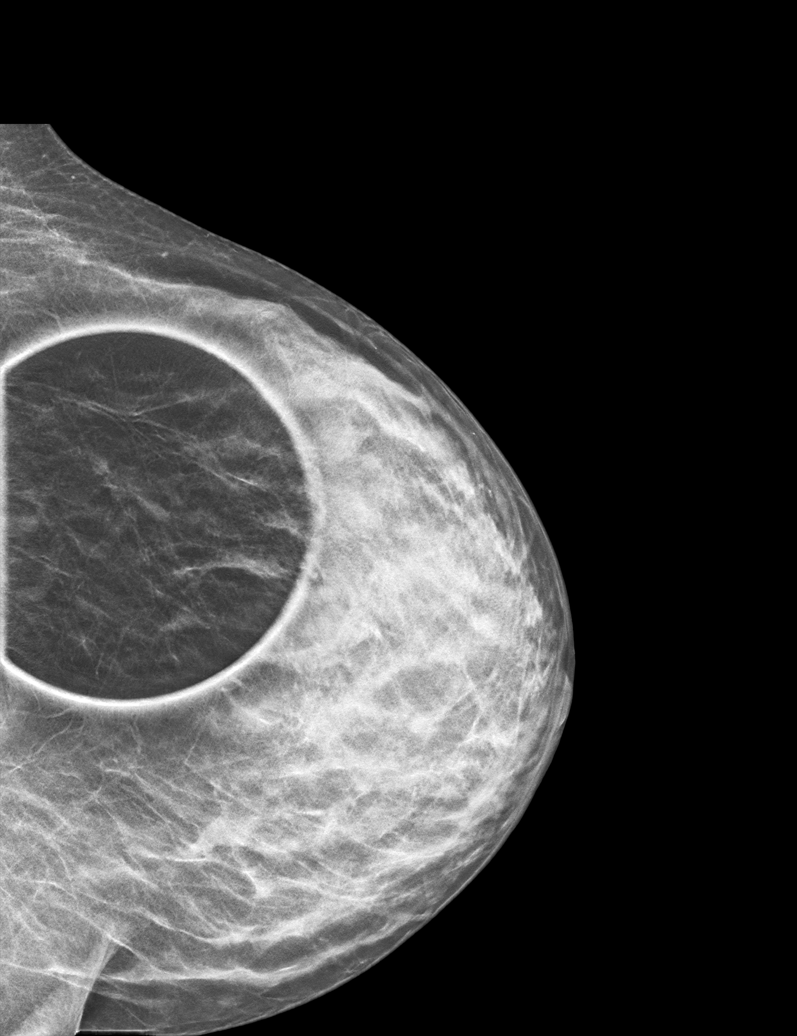

[L MLO synth-2D (2 of 2)]
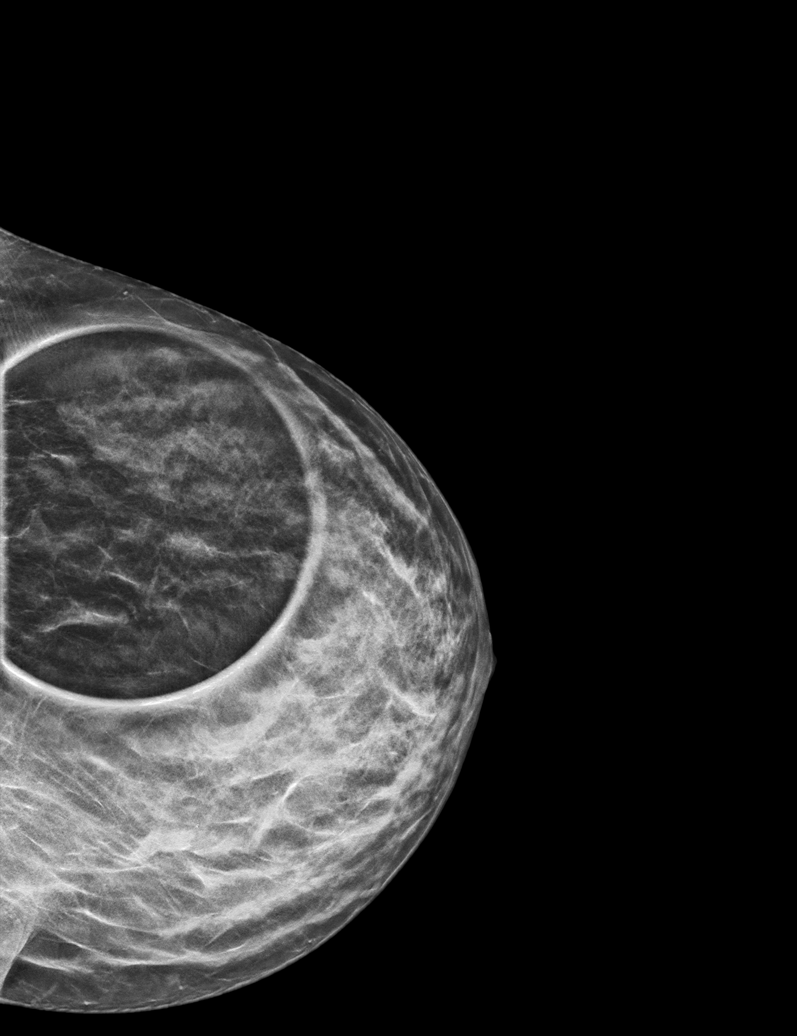

[R MLO synth-2D]
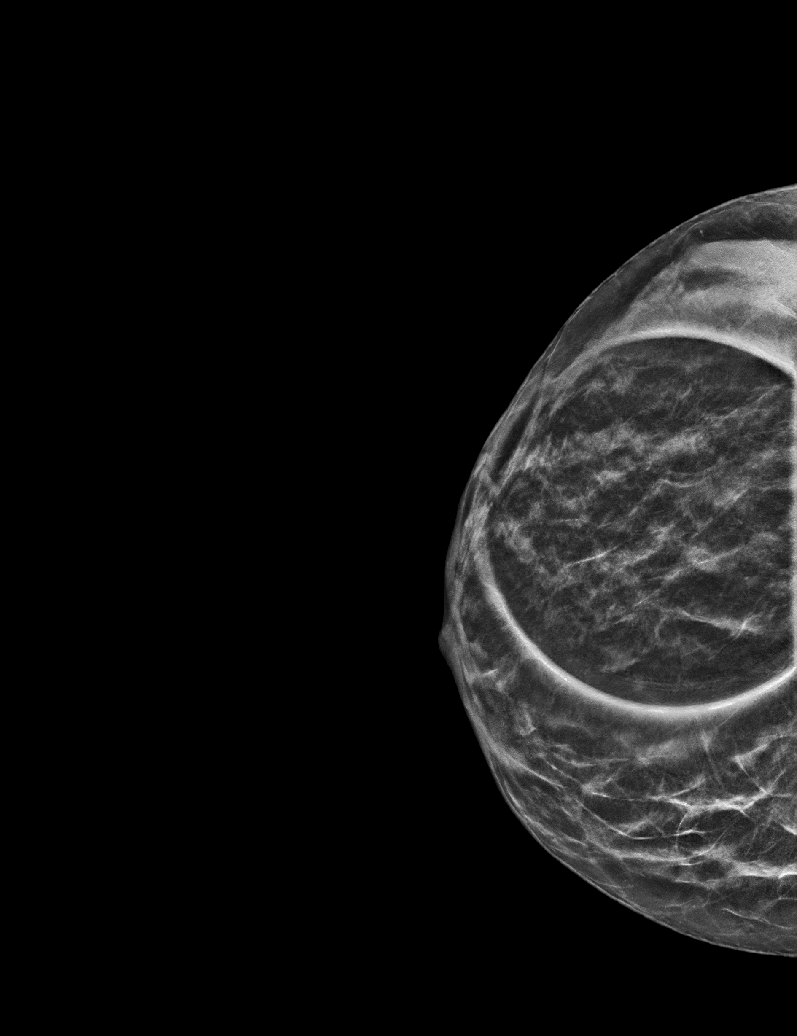

[L ML synth-2D]
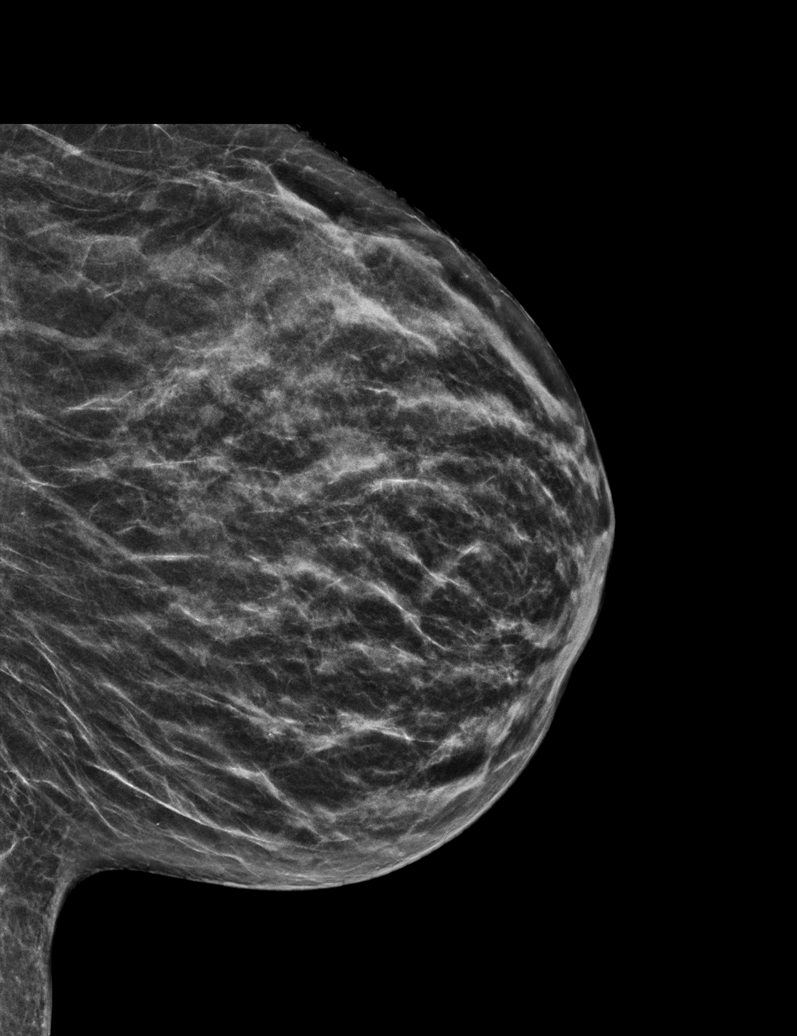

[R CC synth-2D]
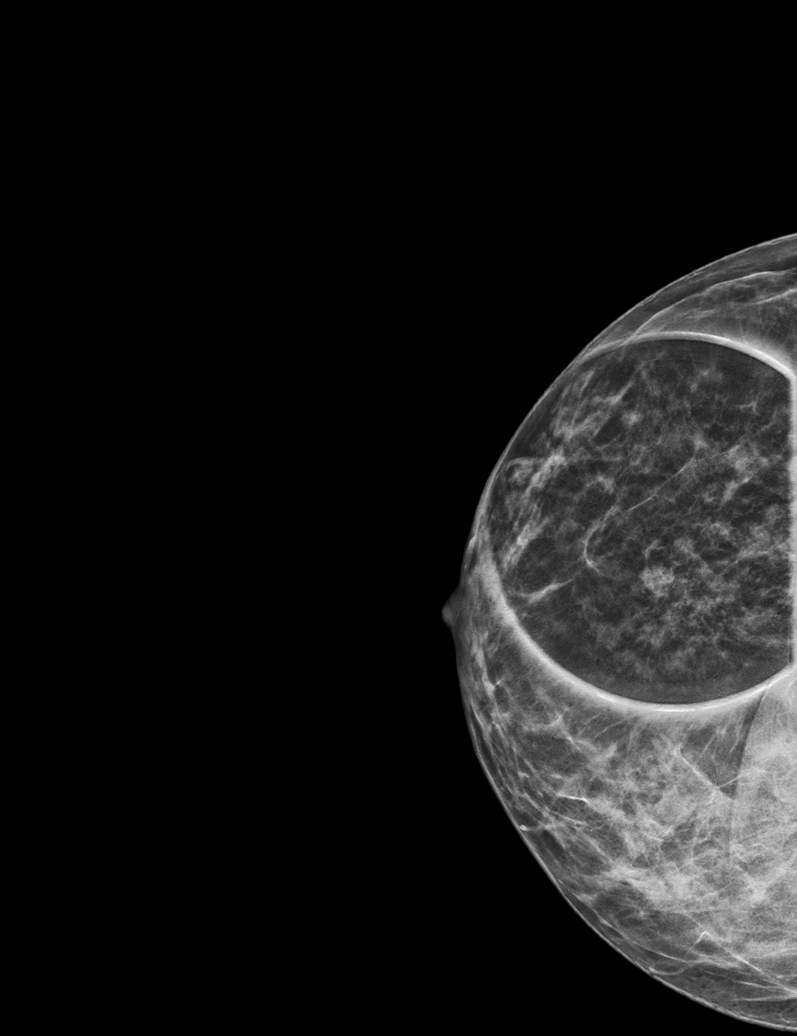

[R CC tomo · tomo slice 17/33.0]
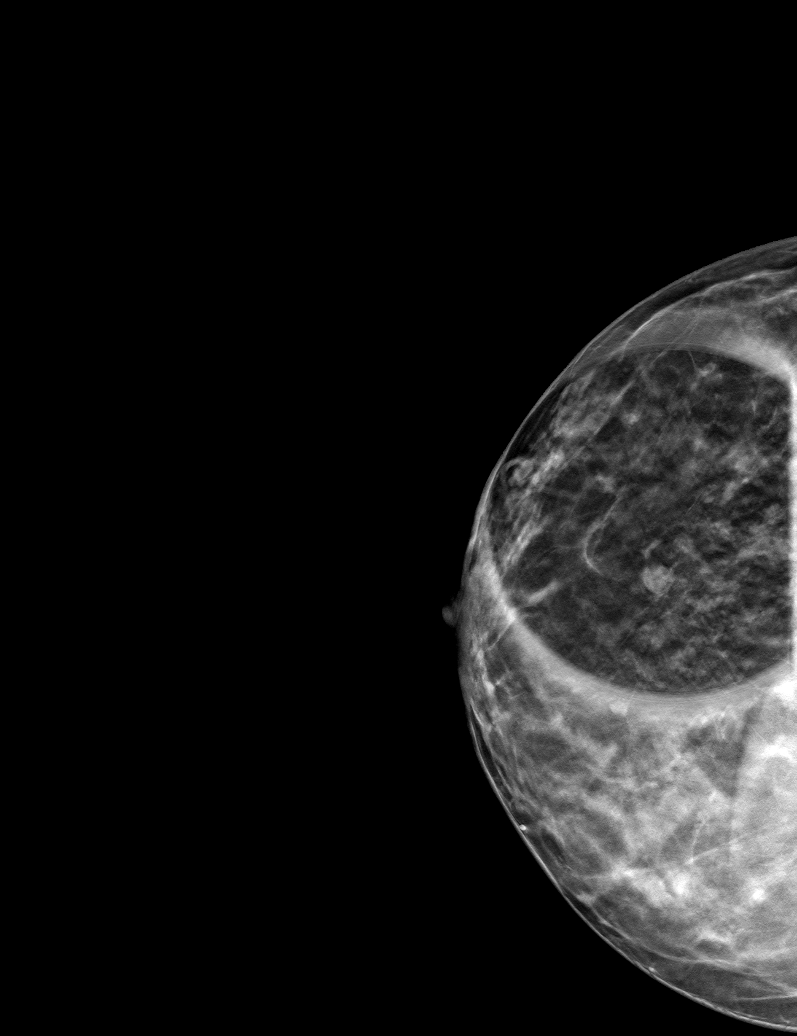

[6 of 30 positions shown; findings below may reference images not displayed]

ACR Breast Density Category c: The breast tissue is heterogeneously
dense, which may obscure small masses.
FINDINGS: RIGHT breast: On today's additional diagnostic views, multiple
obscured masses are confirmed within the outer RIGHT breast.

LEFT breast: On today's additional diagnostic views, a partially
obscured mass is confirmed within the upper LEFT breast.

Mammographic images were processed with CAD.

RIGHT breast: Targeted ultrasound is performed, showing 2 abutting
benign cysts at the 10 o'clock axis, 3 cm from the nipple, measuring
4 mm and 3 mm respectively, corresponding to the mammographic
finding. There is an additional benign cyst within the RIGHT breast
at the 9 o'clock axis, measuring 4 mm, corresponding as an
incidental finding. No suspicious solid or cystic mass is identified
by ultrasound.

LEFT breast: Targeted ultrasound is performed, showing a benign cyst
at the 1:30 o'clock axis, 4 cm from the nipple, measuring 7 mm,
corresponding to mammographic finding. Additional benign cluster of
cysts are identified in the LEFT breast at the 1:30 o'clock axis, 5
cm from the nipple, corresponding as an incidental finding. No
suspicious solid or cystic mass is identified by ultrasound.
IMPRESSION: No evidence of malignancy within either breast. Benign cysts within
each breast, corresponding to the mammographic findings.

Patient may return to routine annual bilateral screening mammogram
schedule.

RECOMMENDATION:
Screening mammogram in one year.(Code:[id number])

I have discussed the findings and recommendations with the patient.
If applicable, a reminder letter will be sent to the patient
regarding the next appointment.

BI-RADS CATEGORY  2: Benign.

## 2020-11-07 IMAGING — US US BREAST*L* LIMITED INC AXILLA
1 series · 10 of 10 positions shown · non-contrast
Comparison: Baseline screening mammogram dated 06/06/2019.

CLINICAL DATA: Patient returns today to evaluate possible bilateral
breast masses identified on recent baseline screening mammogram.

EXAM:
DIGITAL DIAGNOSTIC BILATERAL MAMMOGRAM WITH CAD AND TOMO
ULTRASOUND BILATERAL BREAST

[Series 1: us breast*left* limited inc axilla · 0.06mm/px · 10 of 10 slices shown]
[im 1/10]
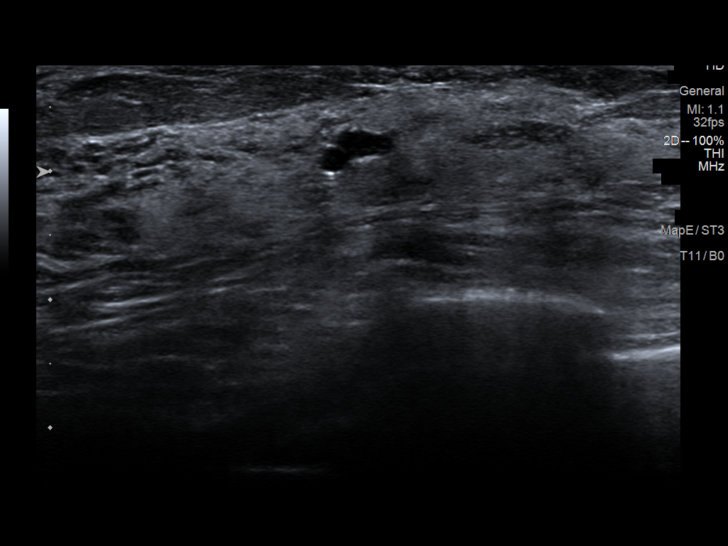
[im 2/10]
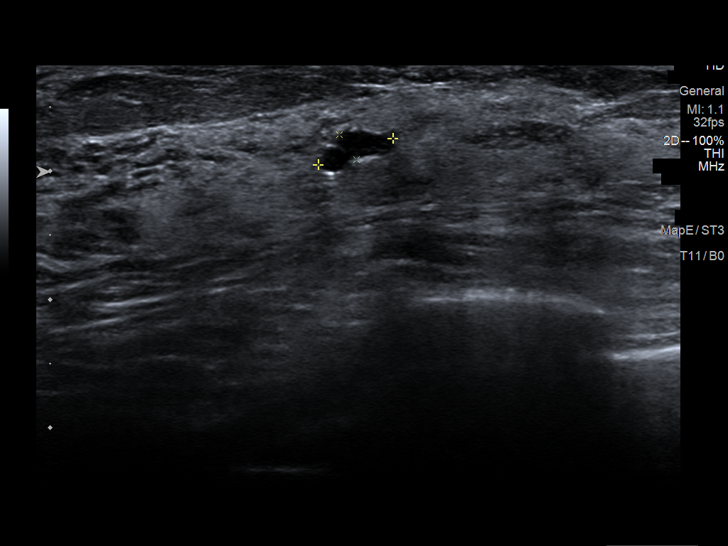
[im 3/10]
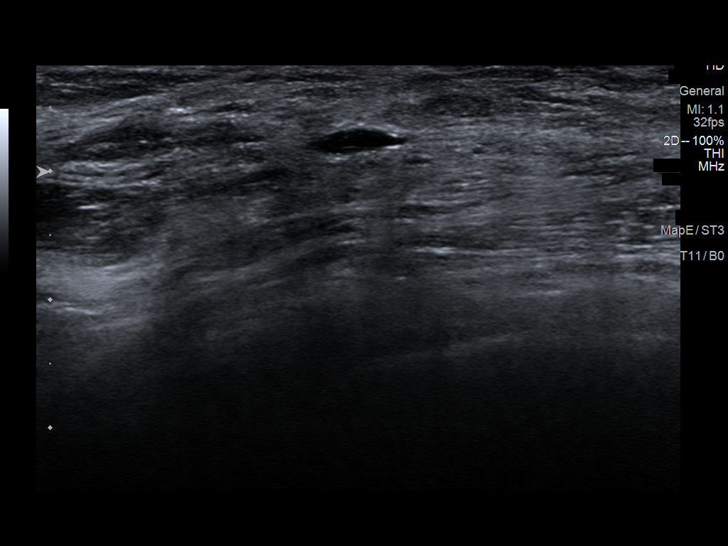
[im 4/10]
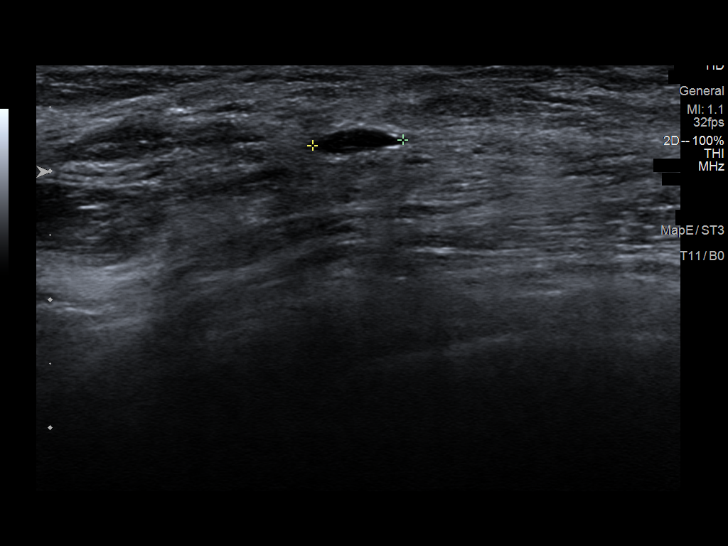
[im 5/10]
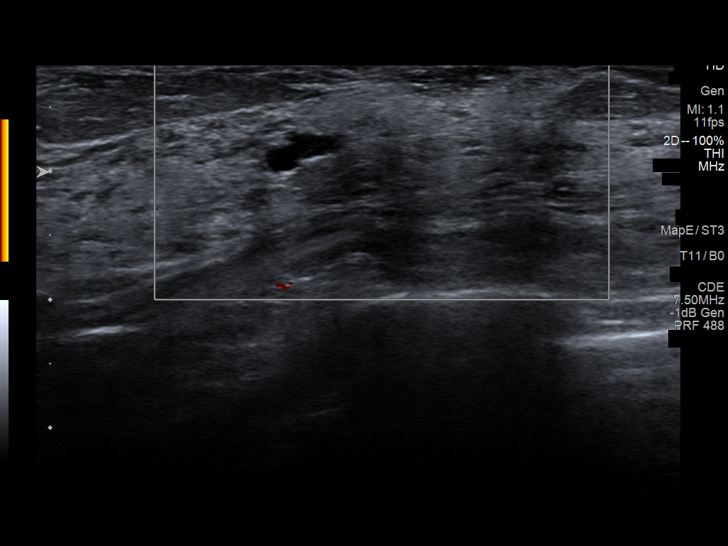
[im 6/10]
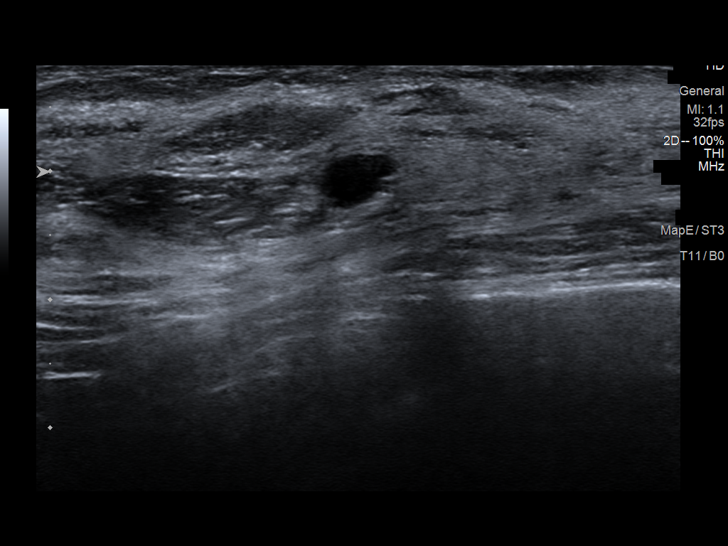
[im 7/10]
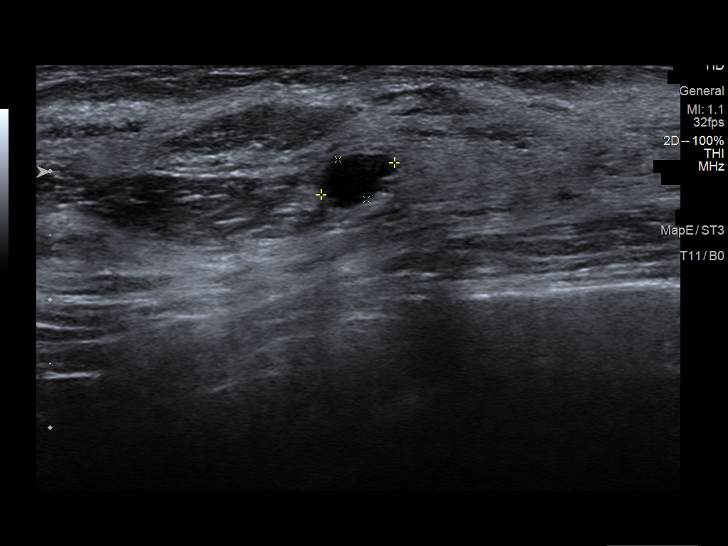
[im 8/10]
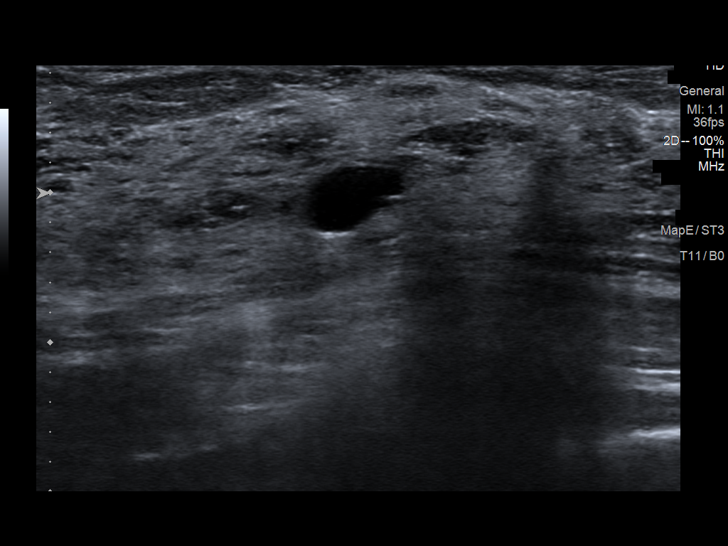
[im 9/10]
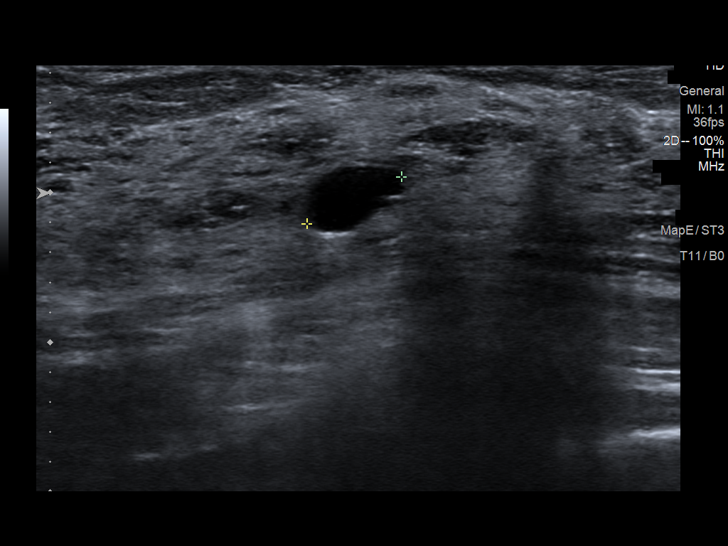
[im 10/10]
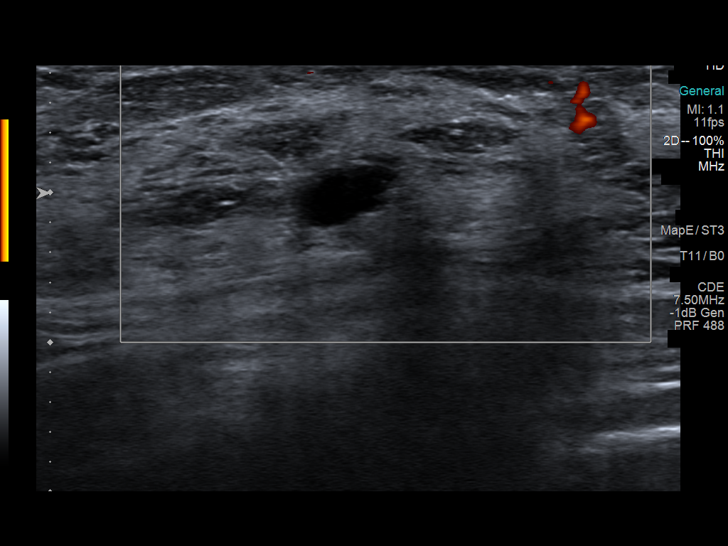

[10 of 10 positions shown; findings below may reference images not displayed]

ACR Breast Density Category c: The breast tissue is heterogeneously
dense, which may obscure small masses.
FINDINGS: RIGHT breast: On today's additional diagnostic views, multiple
obscured masses are confirmed within the outer RIGHT breast.

LEFT breast: On today's additional diagnostic views, a partially
obscured mass is confirmed within the upper LEFT breast.

Mammographic images were processed with CAD.

RIGHT breast: Targeted ultrasound is performed, showing 2 abutting
benign cysts at the 10 o'clock axis, 3 cm from the nipple, measuring
4 mm and 3 mm respectively, corresponding to the mammographic
finding. There is an additional benign cyst within the RIGHT breast
at the 9 o'clock axis, measuring 4 mm, corresponding as an
incidental finding. No suspicious solid or cystic mass is identified
by ultrasound.

LEFT breast: Targeted ultrasound is performed, showing a benign cyst
at the 1:30 o'clock axis, 4 cm from the nipple, measuring 7 mm,
corresponding to mammographic finding. Additional benign cluster of
cysts are identified in the LEFT breast at the 1:30 o'clock axis, 5
cm from the nipple, corresponding as an incidental finding. No
suspicious solid or cystic mass is identified by ultrasound.
IMPRESSION: No evidence of malignancy within either breast. Benign cysts within
each breast, corresponding to the mammographic findings.

Patient may return to routine annual bilateral screening mammogram
schedule.

RECOMMENDATION:
Screening mammogram in one year.(Code:[id number])

I have discussed the findings and recommendations with the patient.
If applicable, a reminder letter will be sent to the patient
regarding the next appointment.

BI-RADS CATEGORY  2: Benign.
